# Patient Record
Sex: Female | Born: 1970 | Race: Black or African American | Hispanic: No | Marital: Single | Smoking: Current every day smoker
Health system: Southern US, Community
[De-identification: ages and names within clinical notes are randomized; demographics above are authoritative.]

## PROBLEM LIST (undated history)

## (undated) DIAGNOSIS — R569 Unspecified convulsions: Secondary | ICD-10-CM

---

## 2006-01-30 ENCOUNTER — Emergency Department (HOSPITAL_COMMUNITY): Admission: EM | Admit: 2006-01-30 | Discharge: 2006-01-30 | Payer: Self-pay | Admitting: Emergency Medicine

## 2006-05-08 ENCOUNTER — Emergency Department (HOSPITAL_COMMUNITY): Admission: EM | Admit: 2006-05-08 | Discharge: 2006-05-08 | Payer: Self-pay | Admitting: Emergency Medicine

## 2007-01-13 ENCOUNTER — Emergency Department (HOSPITAL_COMMUNITY): Admission: EM | Admit: 2007-01-13 | Discharge: 2007-01-13 | Payer: Self-pay | Admitting: Emergency Medicine

## 2007-01-14 ENCOUNTER — Inpatient Hospital Stay (HOSPITAL_COMMUNITY): Admission: EM | Admit: 2007-01-14 | Discharge: 2007-01-16 | Payer: Self-pay | Admitting: Emergency Medicine

## 2007-01-14 ENCOUNTER — Ambulatory Visit: Payer: Self-pay | Admitting: Infectious Disease

## 2007-02-14 ENCOUNTER — Emergency Department (HOSPITAL_COMMUNITY): Admission: EM | Admit: 2007-02-14 | Discharge: 2007-02-14 | Payer: Self-pay | Admitting: Emergency Medicine

## 2007-03-23 ENCOUNTER — Emergency Department (HOSPITAL_COMMUNITY): Admission: EM | Admit: 2007-03-23 | Discharge: 2007-03-23 | Payer: Self-pay | Admitting: Emergency Medicine

## 2009-04-29 IMAGING — CR DG CHEST 2V
2 series · 2 of 2 positions shown · non-contrast
Comparison: None available.

CHEST - 2 VIEW
CLINICAL DATA: Reaction to medications, seizure.

[w chest pa]
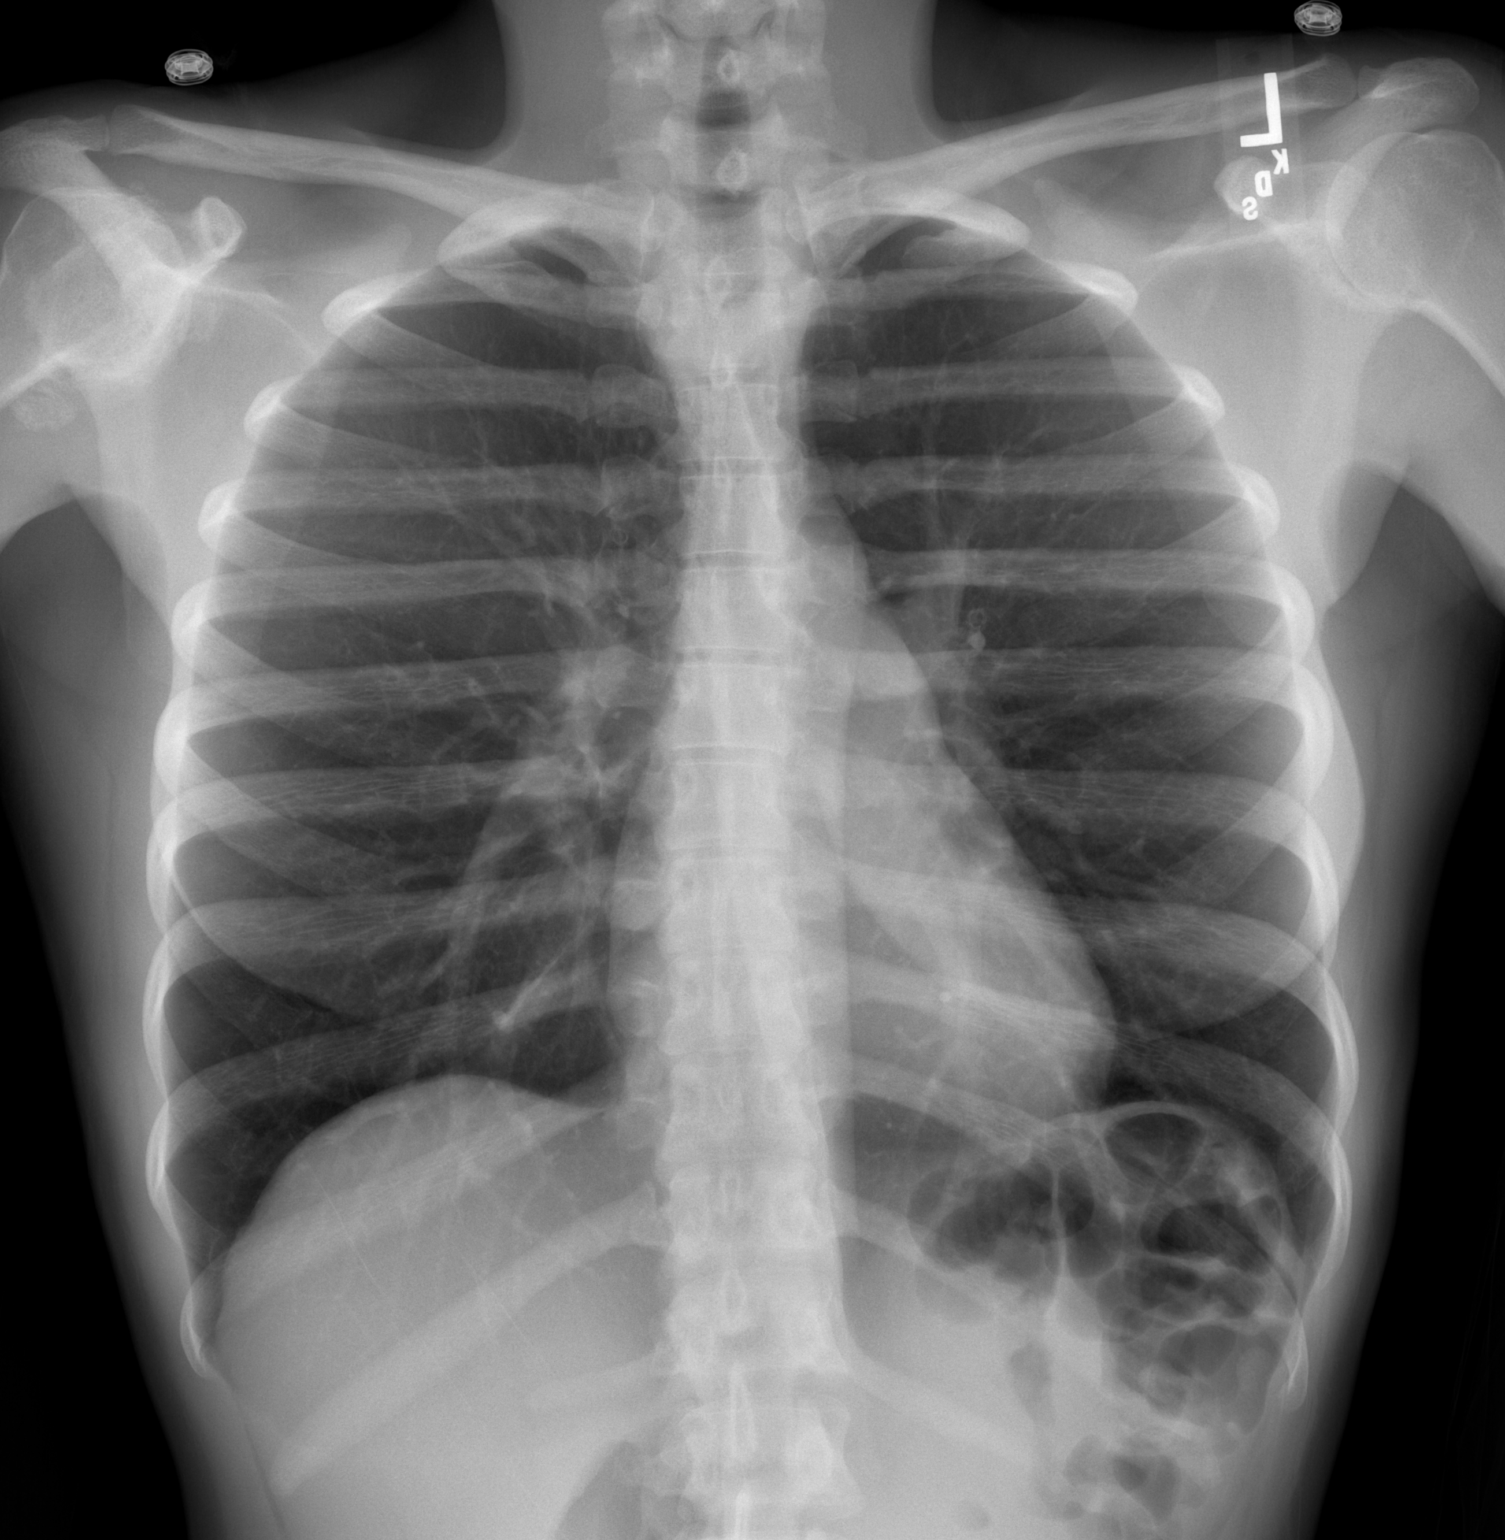

[w chest lat]
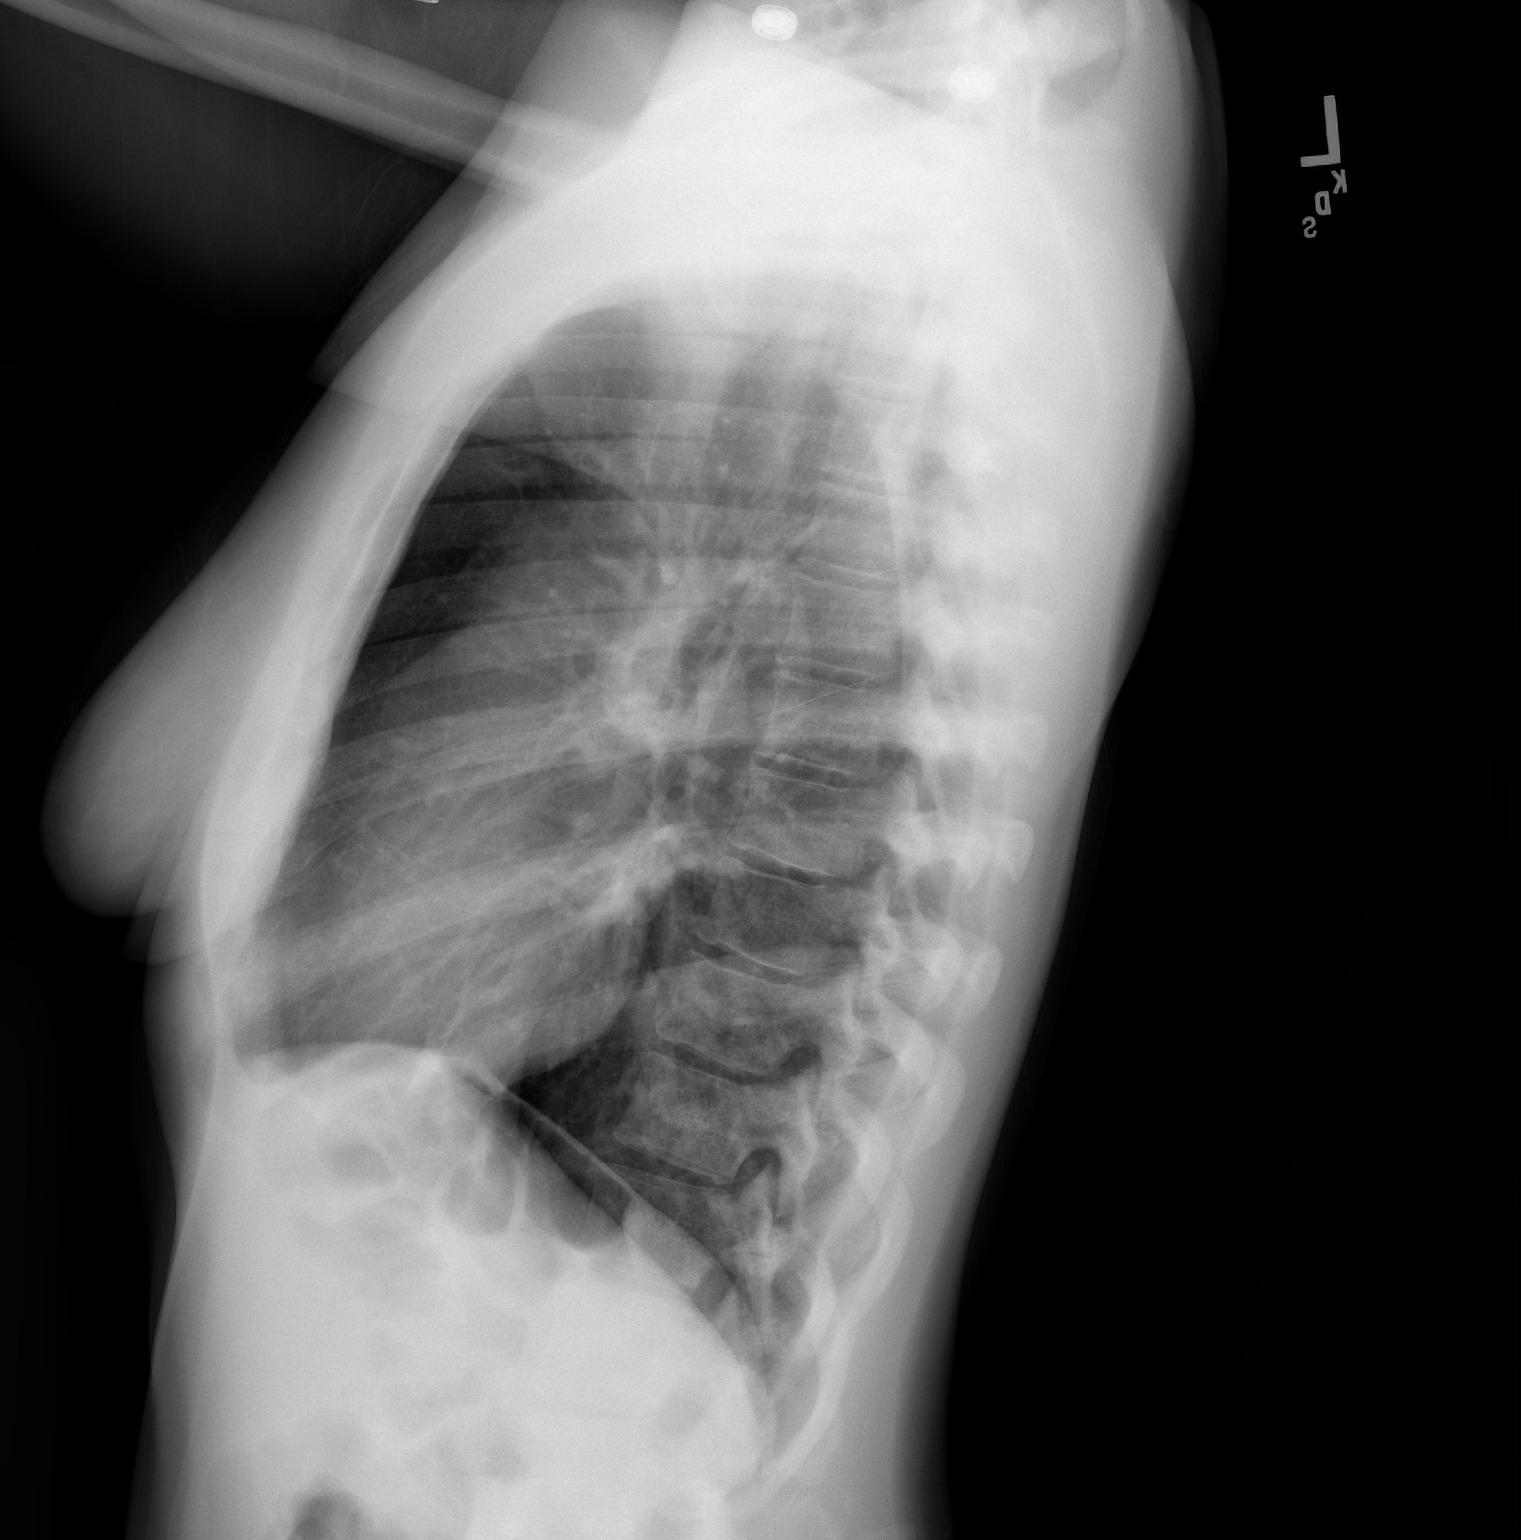

[2 of 2 positions shown; findings below may reference images not displayed]

FINDINGS: Hyperinflation is present bilaterally. No pneumothorax. No airspace
disease, edema, or effusions. Cardiomediastinal contours appear within normal
limits. Apparent loose body in the right axillary pouch/right shoulder. Consider
dedicated right shoulder films, if clinically indicated.
**********************************************************************
IMPRESSION
Hyperinflation without other cardiopulmonary disease. 
**********************************************************************
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
## 2010-07-18 NOTE — Consult Note (Signed)
NAMEMarland Kitchen  MONTINA, DORRANCE NO.:  [id number]   MEDICAL RECORD NO.:  [id number]          PATIENT TYPE:  INP   LOCATION:  3705                         FACILITY:  [facility]   PHYSICIAN:  Vesta Mixer, M.D. DATE OF BIRTH:  [birth year]   DATE OF CONSULTATION:  01/15/2007  DATE OF DISCHARGE:  01/16/2007                                 CONSULTATION   Jaime Harrison is a 40 year old female who is admitted with some chest  pain and mental status changes.  We are asked to see her today for some  evaluation of chest pain.   Jaime Harrison has a history of seizure disorders.  She had moved a lot of boxes  over the weekend.  Today she presented with some musculoskeletal-type  chest pain.  She noticed some chest wall spasms.  She was seen in the  clinic and was found to have some mild EKG abnormalities.  She was  admitted for further evaluation.   The patient states that the pain is largely in her chest wall.  It lasts  for a few seconds.  It is typically sharp.  There is no radiation.  It  is not associated with shortness of breath.  There is no diaphoresis.  She denies any syncope or presyncope.   CURRENT MEDICATIONS:  Dilantin 100 mg every 8 hours, iron sulfate 3  times a day, Tylenol as needed, Ativan as needed.   ALLERGIES:  NO KNOWN DRUG ALLERGIES.   PAST MEDICAL HISTORY:  Seizure disorder.   SOCIAL HISTORY:  The patient is a nonsmoker.   REVIEW OF SYSTEMS:  Reviewed and is essentially negative except as noted  in HPI.   PHYSICAL EXAMINATION:  GENERAL:  She is a young female in no acute  distress.  She is alert and oriented x 3 and her mood and affect are  normal.  VITAL SIGNS:  Temperature is 98.5, heart rate 70, blood pressure 120/82.  Her O2 saturation is 100% on room air.  HEENT:  Exam reveals 2+ carotids.  She has no bruits, no JVD, no  thyromegaly.  LUNGS:  Clear to auscultation.  HEART:  Regular rate, S1-S2.  ABDOMEN:  Exam reveals good bowel sounds and is nontender.   EXTREMITIES:  She has no clubbing, cyanosis or edema.  Her exam is nonfocal.   She does not have any significant chest wall tenderness.   Her EKG reveals normal sinus rhythm.  She has some nonspecific ST/T wave  changes which appear to be most consistent to lead placement.   Laboratory data reveals negative cardiac enzymes.  Her total CPK was  fairly high on admission, which is consistent with generalized  musculoskeletal strain.   IMPRESSION AND PLAN:  Chest pain.  This is most likely a noncardiac  etiology.  She moved a lot of boxes over the weekend and her total CPK  was high with negative MB and negative troponins.  I do not think that  she needs any additional workup at this time.  I would be happy to see  her in the office as an outpatient if needed.  ______________________________  Vesta Mixer, M.D.     PJN/MEDQ  D:  01/15/2007  T:  01/16/2007  Job:  [id number]   cc:   Mariea Stable, MD  Olene Craven, M.D.

## 2010-12-12 LAB — BASIC METABOLIC PANEL
BUN: 14
BUN: 7
Calcium: 9.4
Creatinine, Ser: 0.98
Creatinine, Ser: 1.07
GFR calc Af Amer: >60
GFR calc non Af Amer: 57 — ABNORMAL LOW
Glucose, Bld: 91
Potassium: 3.4 — ABNORMAL LOW
Sodium: 139
Sodium: 140

## 2010-12-12 LAB — URINE MICROSCOPIC-ADD ON

## 2010-12-12 LAB — CARDIAC PANEL(CRET KIN+CKTOT+MB+TROPI)
Relative Index: 0.6
Total CK: 323 — ABNORMAL HIGH
Troponin I: 0.01

## 2010-12-12 LAB — URINALYSIS, ROUTINE W REFLEX MICROSCOPIC
Glucose, UA: NEGATIVE
Ketones, ur: 40 — AB
Nitrite: NEGATIVE
Urobilinogen, UA: 1
pH: 6

## 2010-12-12 LAB — CBC
MCHC: 31.8
MCHC: 31.9
MCV: 73.5 — ABNORMAL LOW
Platelets: 206
Platelets: 209
RDW: 13.6
RDW: 13.9
RDW: 13.9
WBC: 4.2

## 2010-12-12 LAB — COMPREHENSIVE METABOLIC PANEL
BUN: 14
Calcium: 9.3
Chloride: 103
Creatinine, Ser: 0.94
Potassium: 4.1
Sodium: 137
Total Bilirubin: 0.7
Total Protein: 7.6

## 2010-12-12 LAB — DIFFERENTIAL
Basophils Absolute: 0
Basophils Relative: 1
Eosinophils Relative: 1
Lymphs Abs: 1.6
Monocytes Absolute: 0.4
Monocytes Relative: 9
Neutro Abs: 2.3

## 2010-12-12 LAB — TSH: TSH: 0.678

## 2010-12-12 LAB — IRON AND TIBC
Saturation Ratios: 27
UIBC: 226

## 2010-12-12 LAB — FOLATE RBC: RBC Folate: 756 — ABNORMAL HIGH

## 2010-12-12 LAB — RAPID URINE DRUG SCREEN, HOSP PERFORMED
Amphetamines: NOT DETECTED
Barbiturates: NOT DETECTED

## 2010-12-12 LAB — LIPID PANEL
HDL: 57
LDL Cholesterol: 93
Total CHOL/HDL Ratio: 2.8
VLDL: 8

## 2010-12-12 LAB — POCT I-STAT CREATININE
Creatinine, Ser: 1.1
Operator id: 272551

## 2010-12-12 LAB — I-STAT 8, (EC8 V) (CONVERTED LAB)
Bicarbonate: 26.6 — ABNORMAL HIGH
Chloride: 105
TCO2: 28
pCO2, Ven: 49.2
pH, Ven: 7.341 — ABNORMAL HIGH

## 2010-12-12 LAB — URINE CULTURE: Colony Count: >=100000

## 2010-12-12 LAB — HEPATITIS PANEL, ACUTE
HCV Ab: NEGATIVE
Hep A IgM: NEGATIVE
Hep B C IgM: NEGATIVE
Hepatitis B Surface Ag: NEGATIVE

## 2010-12-12 LAB — CK TOTAL AND CKMB (NOT AT ARMC)
CK, MB: 2.9
Relative Index: 0.6
Total CK: 468 — ABNORMAL HIGH

## 2010-12-12 LAB — POCT CARDIAC MARKERS: Myoglobin, poc: 143

## 2010-12-12 LAB — PHENYTOIN LEVEL, TOTAL: Phenytoin Lvl: 11.2

## 2010-12-12 LAB — HIV ANTIBODY (ROUTINE TESTING W REFLEX): HIV: NONREACTIVE

## 2010-12-12 LAB — PREGNANCY, URINE: Preg Test, Ur: NEGATIVE

## 2013-06-25 ENCOUNTER — Encounter (HOSPITAL_BASED_OUTPATIENT_CLINIC_OR_DEPARTMENT_OTHER): Payer: Self-pay | Admitting: Emergency Medicine

## 2013-06-25 ENCOUNTER — Emergency Department (HOSPITAL_BASED_OUTPATIENT_CLINIC_OR_DEPARTMENT_OTHER)
Admission: EM | Admit: 2013-06-25 | Discharge: 2013-06-25 | Disposition: A | Discharge status: Home / Self Care | Payer: Self-pay | Attending: Emergency Medicine | Admitting: Emergency Medicine

## 2013-06-25 DIAGNOSIS — Z79899 Other long term (current) drug therapy: Secondary | ICD-10-CM | POA: Insufficient documentation

## 2013-06-25 DIAGNOSIS — G40909 Epilepsy, unspecified, not intractable, without status epilepticus: Secondary | ICD-10-CM | POA: Insufficient documentation

## 2013-06-25 DIAGNOSIS — F17200 Nicotine dependence, unspecified, uncomplicated: Secondary | ICD-10-CM | POA: Insufficient documentation

## 2013-06-25 HISTORY — DX: Unspecified convulsions: R56.9

## 2013-06-25 MED ORDER — PHENYTOIN SODIUM EXTENDED 100 MG PO CAPS
300.0000 mg | ORAL_CAPSULE | Freq: Once | ORAL | Status: AC
  Administered 2013-06-25: 300 mg via ORAL
  Filled 2013-06-25: qty 3

## 2013-06-25 MED ORDER — PHENYTOIN SODIUM EXTENDED 100 MG PO CAPS: 100.0000 mg | ORAL_CAPSULE | Freq: Three times a day (TID) | ORAL | Status: AC

## 2013-06-25 NOTE — ED Notes (Signed)
Seizure last night per pts daughter. She ran out of medication a week ago. She is alert oriented on arrival.

## 2013-06-25 NOTE — ED Provider Notes (Signed)
CSN: [id number]     Arrival date & time 06/25/13  1554 History   First MD Initiated Contact with Patient 06/25/13 1607     Chief Complaint  Patient presents with  . Seizures     (Consider location/radiation/quality/duration/timing/severity/associated sxs/prior Treatment) Patient is a 43 y.o. female presenting with seizures.  Seizures  Pt with long history of seizures previously managed on Dilantin 100mg  TID reports she ran out about a week ago, has had no dilantin during that time. She apparently had a seizure in her sleep last night. She has no recollection of the event, this was relayed to her by her daughter today who insisted she come to the ED for evaluation. She has felt fine throughout the day today. She reports no insurance and cannot afford PCP visits to get long term Rx.    Past Medical History  Diagnosis Date  . Seizures    Past Surgical History  Procedure Laterality Date  . Cesarean section     No family history on file. History  Substance Use Topics  . Smoking status: Current Every Day Smoker -- 0.50 packs/day    Types: Cigarettes  . Smokeless tobacco: Not on file  . Alcohol Use: No   OB History   Grav Para Term Preterm Abortions TAB SAB Ect Mult Living                 Review of Systems  Neurological: Positive for seizures.   All other systems reviewed and are negative except as noted in HPI.     Allergies  Aspirin  Home Medications   Prior to Admission medications   Medication Sig Start Date End Date Taking? Authorizing Provider  phenytoin (DILANTIN) 100 MG ER capsule Take by mouth 3 (three) times daily.   Yes Historical Provider, MD   BP 128/75  Pulse 69  Temp(Src) 98.9 F (37.2 C) (Oral)  Resp 18  Ht 5\' 1"  (1.549 m)  Wt 130 lb (58.968 kg)  BMI 24.58 kg/m2  SpO2 100%  LMP 06/15/2013 Physical Exam  Nursing note and vitals reviewed. Constitutional: She is oriented to person, place, and time. She appears well-developed and well-nourished.   HENT:  Head: Normocephalic and atraumatic.  Eyes: EOM are normal. Pupils are equal, round, and reactive to light.  Neck: Normal range of motion. Neck supple.  Cardiovascular: Normal rate, normal heart sounds and intact distal pulses.   Pulmonary/Chest: Effort normal and breath sounds normal.  Abdominal: Bowel sounds are normal. She exhibits no distension. There is no tenderness.  Musculoskeletal: Normal range of motion. She exhibits no edema and no tenderness.  Neurological: She is alert and oriented to person, place, and time. She has normal strength. No cranial nerve deficit or sensory deficit.  Skin: Skin is warm and dry. No rash noted.  Psychiatric: She has a normal mood and affect.    ED Course  Procedures (including critical care time) Labs Review Labs Reviewed - No data to display  Imaging Review No results found.   EKG Interpretation None      MDM   Final diagnoses:  Seizure disorder   Pt with reported seizure but feeling fine now. Admits to running out of dilantin at home last week, no utility in checking levels. Will give a oral loading dose, 300mg  x 3 over the next 4-6 hours. Rx for her regular dilantin dose.     Charles B. Bernette MayersSheldon, MD 06/25/13 [phone]

## 2013-06-25 NOTE — Discharge Instructions (Signed)
You were given 300mg  of Dilantin in the ER. Please take an additional 300mg  (3 tablets) at around 7pm and another 300mg  (3 tablets) around 10pm tonight as a loading dose. Resume your regular dosing tomorrow.   Seizure, Adult A seizure is abnormal electrical activity in the brain. Seizures usually last from 30 seconds to 2 minutes. There are various types of seizures. Before a seizure, you may have a warning sensation (aura) that a seizure is about to occur. An aura may include the following symptoms:   Fear or anxiety.  Nausea.  Feeling like the room is spinning (vertigo).  Vision changes, such as seeing flashing lights or spots. Common symptoms during a seizure include:  A change in attention or behavior (altered mental status).  Convulsions with rhythmic jerking movements.  Drooling.  Rapid eye movements.  Grunting.  Loss of bladder and bowel control.  Bitter taste in the mouth.  Tongue biting. After a seizure, you may feel confused and sleepy. You may also have an injury resulting from convulsions during the seizure. HOME CARE INSTRUCTIONS   If you are given medicines, take them exactly as prescribed by your health care provider.  Keep all follow-up appointments as directed by your health care provider.  Do not swim or drive or engage in risky activity during which a seizure could cause further injury to you or others until your health care provider says it is OK.  Get adequate rest.  Teach friends and family what to do if you have a seizure. They should:  Lay you on the ground to prevent a fall.  Put a cushion under your head.  Loosen any tight clothing around your neck.  Turn you on your side. If vomiting occurs, this helps keep your airway clear.  Stay with you until you recover.  Know whether or not you need emergency care. SEEK IMMEDIATE MEDICAL CARE IF:  The seizure lasts longer than 5 minutes.  The seizure is severe or you do not wake up immediately  after the seizure.  You have an altered mental status after the seizure.  You are having more frequent or worsening seizures. Someone should drive you to the emergency department or call local emergency services (911 in [place] MAKE SURE YOU:  Understand these instructions.  Will watch your condition.  Will get help right away if you are not doing well or get worse. Document Released: 02/17/2000 Document Revised: 12/10/2012 Document Reviewed: 10/01/2012 [facility] Patient Information 2014 [employer] [place], [place].
